# Patient Record
Sex: Male | Born: 1980 | Hispanic: No | Marital: Married | Smoking: Never smoker
Health system: Southern US, Community
[De-identification: ages and names within clinical notes are randomized; demographics above are authoritative.]

## PROBLEM LIST (undated history)

## (undated) HISTORY — PX: APPENDECTOMY: SHX54

---

## 2017-08-04 ENCOUNTER — Emergency Department (HOSPITAL_BASED_OUTPATIENT_CLINIC_OR_DEPARTMENT_OTHER)
Admission: EM | Admit: 2017-08-04 | Discharge: 2017-08-05 | Disposition: A | Discharge status: Home / Self Care | Payer: BLUE CROSS/BLUE SHIELD | Attending: Emergency Medicine | Admitting: Emergency Medicine

## 2017-08-04 ENCOUNTER — Other Ambulatory Visit: Payer: Self-pay

## 2017-08-04 ENCOUNTER — Emergency Department (HOSPITAL_BASED_OUTPATIENT_CLINIC_OR_DEPARTMENT_OTHER): Payer: BLUE CROSS/BLUE SHIELD

## 2017-08-04 ENCOUNTER — Encounter (HOSPITAL_BASED_OUTPATIENT_CLINIC_OR_DEPARTMENT_OTHER): Payer: Self-pay | Admitting: *Deleted

## 2017-08-04 DIAGNOSIS — S0990XA Unspecified injury of head, initial encounter: Secondary | ICD-10-CM | POA: Diagnosis present

## 2017-08-04 DIAGNOSIS — W0110XA Fall on same level from slipping, tripping and stumbling with subsequent striking against unspecified object, initial encounter: Secondary | ICD-10-CM | POA: Diagnosis not present

## 2017-08-04 DIAGNOSIS — Y999 Unspecified external cause status: Secondary | ICD-10-CM | POA: Diagnosis not present

## 2017-08-04 DIAGNOSIS — Y92009 Unspecified place in unspecified non-institutional (private) residence as the place of occurrence of the external cause: Secondary | ICD-10-CM | POA: Diagnosis not present

## 2017-08-04 DIAGNOSIS — W19XXXA Unspecified fall, initial encounter: Secondary | ICD-10-CM

## 2017-08-04 DIAGNOSIS — Y9301 Activity, walking, marching and hiking: Secondary | ICD-10-CM | POA: Insufficient documentation

## 2017-08-04 DIAGNOSIS — S0292XA Unspecified fracture of facial bones, initial encounter for closed fracture: Secondary | ICD-10-CM | POA: Insufficient documentation

## 2017-08-04 NOTE — ED Notes (Signed)
Pt. Has been seen by EDP Long.   Reports he fell on Thursday causing injury to his mouth and face.  Pt. Has been seen by an Endodontist.  Pt. Came here tonight to have CT due to his fall for unknown reason for syncopal episode and for black eye and facial bruises poss. Fractures.  Pt. Reports he feels fine.

## 2017-08-04 NOTE — ED Provider Notes (Signed)
Emergency Department Provider Note   I have reviewed the triage vital signs and the nursing notes.   HISTORY  Chief Complaint Fall   HPI Taylor Santos is a 37 y.o. male presents to the ED for evaluation after fall yesterday.  Patient states he was diagnosed with the flu and began taking Tamiflu and azithromycin.  He began having diarrhea and severe fatigue.  Patient states he was walking to the bathroom when he suddenly "face planted."  He had some confusion around the event and injured several of his front upper incisors.  He went to the dentist today with plan to see a specialist on Monday.  Patient states he is continued to have headache and swelling around the left eye.  He was talking with family about this who encouraged him to present to the emergency department for further evaluation. Denies any vision change or eye pain.    History reviewed. No pertinent past medical history.  There are no active problems to display for this patient.   Past Surgical History:  Procedure Laterality Date  . APPENDECTOMY      Current Outpatient Rx  . Order #: [id number] Class: Historical Med  . Order #: [id number] Class: Historical Med    Allergies Patient has no known allergies.  No family history on file.  Social History Social History   Tobacco Use  . Smoking status: Never Smoker  . Smokeless tobacco: Never Used  Substance Use Topics  . Alcohol use: No    Frequency: Never  . Drug use: No    Review of Systems  Constitutional: No fever/chills Eyes: No visual changes. ENT: No sore throat. Positive dental pain.  Cardiovascular: Denies chest pain. Respiratory: Denies shortness of breath. Gastrointestinal: No abdominal pain.  No nausea, no vomiting.  No diarrhea.  No constipation. Genitourinary: Negative for dysuria. Musculoskeletal: Negative for back pain. Positive left face pain.  Skin: Negative for rash. Neurological: Negative for focal weakness or numbness. Positive HA.     10-point ROS otherwise negative.  ____________________________________________   PHYSICAL EXAM:  VITAL SIGNS: ED Triage Vitals  Enc Vitals Group     BP 08/04/17 2142 123/80     Pulse Rate 08/04/17 2142 95     Resp 08/04/17 2142 20     Temp 08/04/17 2142 98.5 F (36.9 C)     Temp Source 08/04/17 2142 Oral     SpO2 08/04/17 2142 99 %     Weight 08/04/17 2140 182 lb (82.6 kg)     Height 08/04/17 2140 5' 10.5" (1.791 m)   Constitutional: Alert and oriented. Well appearing and in no acute distress. Eyes: Conjunctivae are normal. PERRL. EOMI. Ecchymosis around and under the left eye.  Head: Atraumatic. Nose: No congestion/rhinnorhea. Mouth/Throat: Mucous membranes are moist.  Oropharynx non-erythematous. Dental fractures of the maxillary incisors.  Neck: No stridor. No cervical spine tenderness to palpation. Cardiovascular: Normal rate, regular rhythm. Good peripheral circulation. Grossly normal heart sounds.   Respiratory: Normal respiratory effort.  No retractions. Lungs CTAB. Gastrointestinal:  No distention.  Musculoskeletal: No lower extremity tenderness nor edema. No gross deformities of extremities. Neurologic:  Normal speech and language. No gross focal neurologic deficits are appreciated.  Skin:  Skin is warm, dry and intact. No rash noted. Bruising as noted above.   ____________________________________________  RADIOLOGY  Ct Head Wo Contrast  Result Date: 08/04/2017 CLINICAL DATA:  Larey Seat, fractures in the front teeth EXAM: CT HEAD WITHOUT CONTRAST CT MAXILLOFACIAL WITHOUT CONTRAST TECHNIQUE: Multidetector CT imaging of the  head and maxillofacial structures were performed using the standard protocol without intravenous contrast. Multiplanar CT image reconstructions of the maxillofacial structures were also generated. COMPARISON:  None. FINDINGS: CT HEAD FINDINGS Brain: No acute territorial infarction, hemorrhage, or intracranial mass is visualized. The ventricles are of  normal size. Vascular: No hyperdense vessel or unexpected calcification. Skull: Normal. Negative for fracture or focal lesion. Other: Soft tissue emphysema within the left temple parietal region. CT MAXILLOFACIAL FINDINGS Osseous: Bilateral mandibular heads are normally positioned. No displaced mandibular fracture is visualized. Zygomatic arches are intact. Acute mildly displaced left lateral pterygoid plate fracture. Acute minimally displaced left medial pterygoid plate fracture. No acute nasal bone fracture is seen. Anterior to posterior oriented fracture through the left maxilla, paralleling the molar teeth with probable horizontal fracture component through the alveolar ridge at the second molar tooth. Fracture lucency extends to the floor of the left maxillary sinus. Orbits: No acute orbital fracture is seen. No intra or extraconal soft tissue abnormality. Sinuses: Hemorrhagic fluid level in the left maxillary sinus. Acute, mildly comminuted fracture involving the apex of the left maxillary sinus with extension of fracture lucency to the left posterior, lateral wall of the left maxillary sinus. Fracture at the floor of the left maxillary sinus. Fracture lateral wall anteriorly of the left maxillary sinus. Soft tissues: Moderate soft tissue emphysema within the left parapharyngeal space, and interspersed among the soft tissues around the left mandibular ramus, posterior and anterior walls of the left maxillary sinus, and extending superiorly to the left temporal region. IMPRESSION: 1. No CT evidence for acute intracranial abnormality. 2. Acute comminuted and mildly displaced fracture involving the apex of the left maxillary sinus with extension of fracture to the left posterior, lateral wall of the maxillary sinus. Additional left anterior maxillary wall sinus fracture. There is additional fracture through the floor of the left maxillary sinus which extends through the maxillary bone and through the left  posterior alveolar ridge at the level of the second molar. 3. Acute mildly displaced left lateral and medial pterygoid plate fractures. 4. Moderate amount of soft tissue emphysema within the deep neck soft tissues, surrounding the mandible and maxillary sinus and extending to the left temporal soft tissues. Electronically Signed   By: Jasmine Pang M.D.   On: 08/04/2017 23:05   Ct Maxillofacial Wo Contrast  Result Date: 08/04/2017 CLINICAL DATA:  Larey Seat, fractures in the front teeth EXAM: CT HEAD WITHOUT CONTRAST CT MAXILLOFACIAL WITHOUT CONTRAST TECHNIQUE: Multidetector CT imaging of the head and maxillofacial structures were performed using the standard protocol without intravenous contrast. Multiplanar CT image reconstructions of the maxillofacial structures were also generated. COMPARISON:  None. FINDINGS: CT HEAD FINDINGS Brain: No acute territorial infarction, hemorrhage, or intracranial mass is visualized. The ventricles are of normal size. Vascular: No hyperdense vessel or unexpected calcification. Skull: Normal. Negative for fracture or focal lesion. Other: Soft tissue emphysema within the left temple parietal region. CT MAXILLOFACIAL FINDINGS Osseous: Bilateral mandibular heads are normally positioned. No displaced mandibular fracture is visualized. Zygomatic arches are intact. Acute mildly displaced left lateral pterygoid plate fracture. Acute minimally displaced left medial pterygoid plate fracture. No acute nasal bone fracture is seen. Anterior to posterior oriented fracture through the left maxilla, paralleling the molar teeth with probable horizontal fracture component through the alveolar ridge at the second molar tooth. Fracture lucency extends to the floor of the left maxillary sinus. Orbits: No acute orbital fracture is seen. No intra or extraconal soft tissue abnormality. Sinuses: Hemorrhagic fluid level in  the left maxillary sinus. Acute, mildly comminuted fracture involving the apex of the  left maxillary sinus with extension of fracture lucency to the left posterior, lateral wall of the left maxillary sinus. Fracture at the floor of the left maxillary sinus. Fracture lateral wall anteriorly of the left maxillary sinus. Soft tissues: Moderate soft tissue emphysema within the left parapharyngeal space, and interspersed among the soft tissues around the left mandibular ramus, posterior and anterior walls of the left maxillary sinus, and extending superiorly to the left temporal region. IMPRESSION: 1. No CT evidence for acute intracranial abnormality. 2. Acute comminuted and mildly displaced fracture involving the apex of the left maxillary sinus with extension of fracture to the left posterior, lateral wall of the maxillary sinus. Additional left anterior maxillary wall sinus fracture. There is additional fracture through the floor of the left maxillary sinus which extends through the maxillary bone and through the left posterior alveolar ridge at the level of the second molar. 3. Acute mildly displaced left lateral and medial pterygoid plate fractures. 4. Moderate amount of soft tissue emphysema within the deep neck soft tissues, surrounding the mandible and maxillary sinus and extending to the left temporal soft tissues. Electronically Signed   By: Jasmine PangKim  Fujinaga M.D.   On: 08/04/2017 23:05    ____________________________________________   PROCEDURES  Procedure(s) performed:   Procedures  None ____________________________________________   INITIAL IMPRESSION / ASSESSMENT AND PLAN / ED COURSE  Pertinent labs & imaging results that were available during my care of the patient were reviewed by me and considered in my medical decision making (see chart for details).  Patient presents to the emergency department for evaluation after fall with face/head injury.  Suspect concussion clinically.  Patient has no focal neurological deficits but does have some ecchymosis around the left eye.   Plan for CT imaging of the head and face to rule out occult fracture/bleed.   12:00 AM Spoke with Dr. Jenne PaneBates with ENT. Agrees with sneeze precautions and 5 day office follow up. No abx. Discussed f/u plan in detail with the patient. Provided CT read to give his dental specialist for appointment on Monday.   At this time, I do not feel there is any life-threatening condition present. I have reviewed and discussed all results (EKG, imaging, lab, urine as appropriate), exam findings with patient. I have reviewed nursing notes and appropriate previous records.  I feel the patient is safe to be discharged home without further emergent workup. Discussed usual and customary return precautions. Patient and family (if present) verbalize understanding and are comfortable with this plan.  Patient will follow-up with their primary care provider. If they do not have a primary care provider, information for follow-up has been provided to them. All questions have been answered.  ____________________________________________  FINAL CLINICAL IMPRESSION(S) / ED DIAGNOSES  Final diagnoses:  Fall, initial encounter  Closed fracture of facial bone due to fall, initial encounter [facility](HCC)    Note:  This document was prepared using Dragon voice recognition software and may include unintentional dictation errors.  Alona BeneJoshua Dayzha Pogosyan, MD Emergency Medicine    Bobbijo Holst, Arlyss RepressJoshua G, MD 08/05/17 [phone]

## 2017-08-04 NOTE — ED Triage Notes (Signed)
He had a positive flu swab 2 days ago. He took Tamaflu and Zithromax. States he fell yesterday, landed on his face and broke some teeth. He was seen by his orthodontist today for mouth injury. He is here tonight for examination to see if he needs a head CT. He did not hit his head. He does not remember events of the fall.

## 2017-08-05 NOTE — Discharge Instructions (Signed)
You were seen in the ED today after a fall. Your head CT was normal but your face CT showed multiple fractures. You will need to call the ENT listed for follow up appointment in 5 days. Sneeze with your mouth open to avoid extra pressure in the face.   Return to the ED with any new or worsening symptoms.

## 2018-08-25 IMAGING — CT CT MAXILLOFACIAL W/O CM
3 series · 14 of 47 positions shown, 16 images · non-contrast
Comparison: None.

CLINICAL DATA: Fell, fractures in the front teeth

EXAM:
CT HEAD WITHOUT CONTRAST
CT MAXILLOFACIAL WITHOUT CONTRAST
TECHNIQUE: Multidetector CT imaging of the head and maxillofacial structures
were performed using the standard protocol without intravenous
contrast. Multiplanar CT image reconstructions of the maxillofacial
structures were also generated.

[Series 2: max soft · axial · 0.33mm/px · z∈[+1010,+1172]mm · 8 of 95 slices shown, 10 images]
[im 7/95  brain]
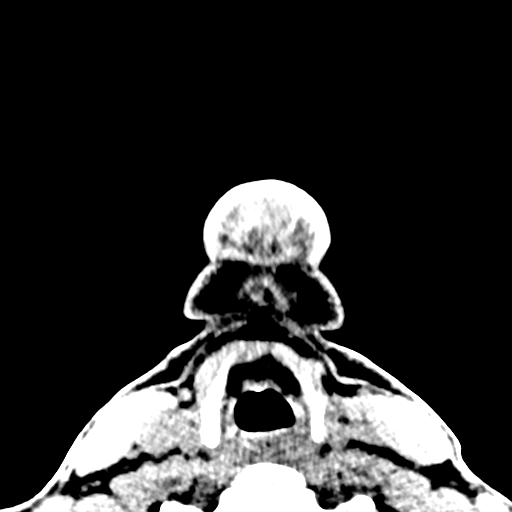
[im 7/95  bone]
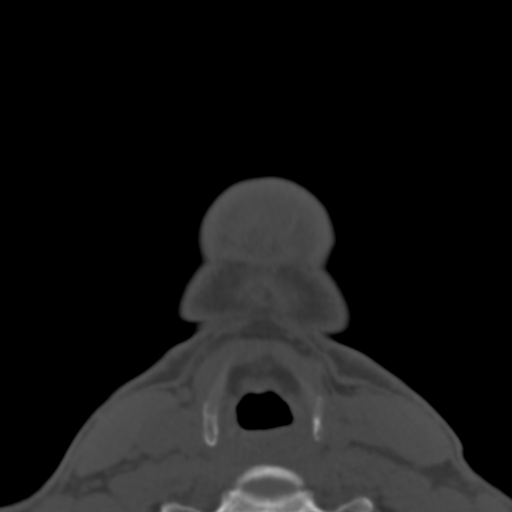
[im 20/95  bone]
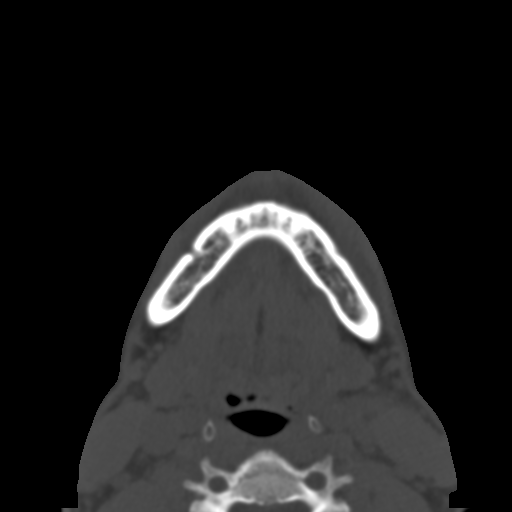
[im 30/95  bone]
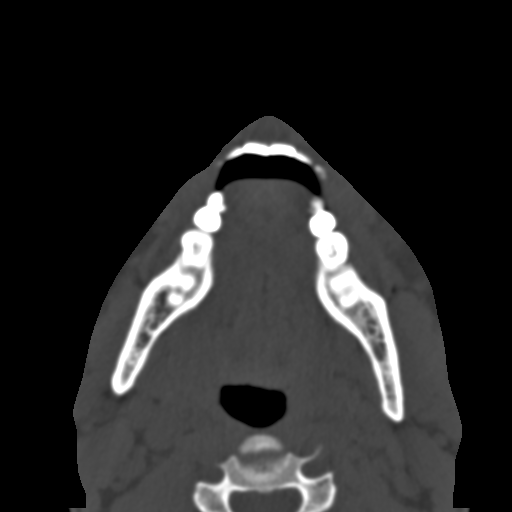
[im 43/95  bone]
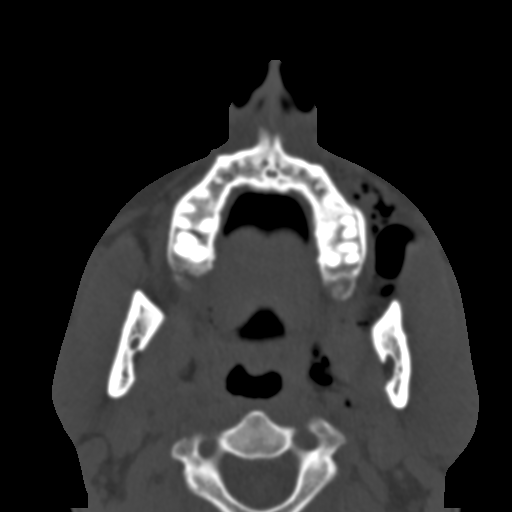
[im 52/95  brain]
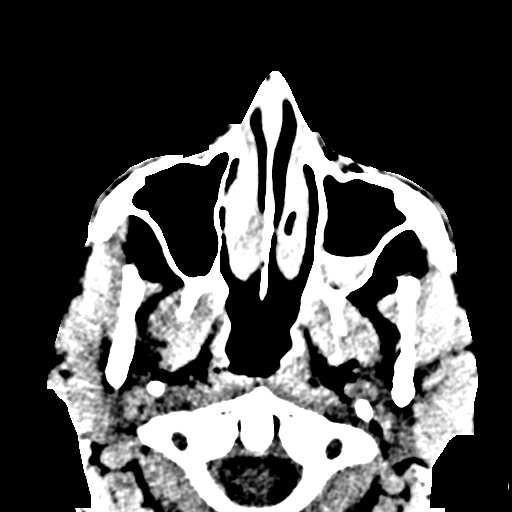
[im 52/95  bone]
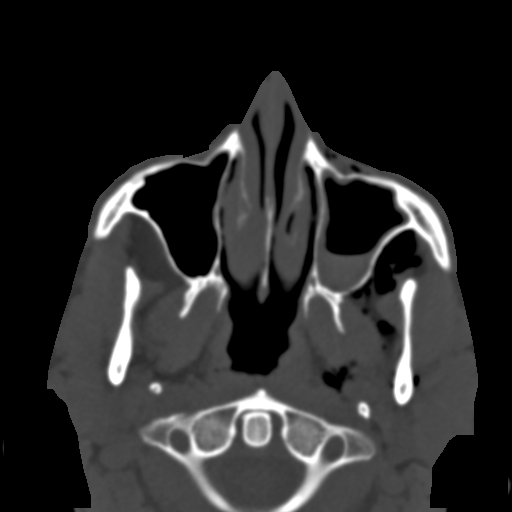
[im 65/95  bone]
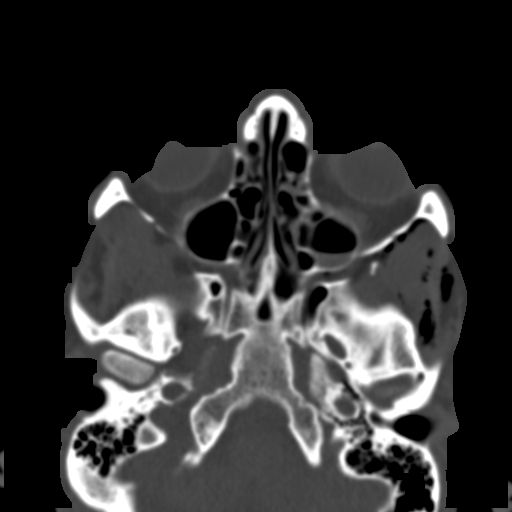
[im 75/95  bone]
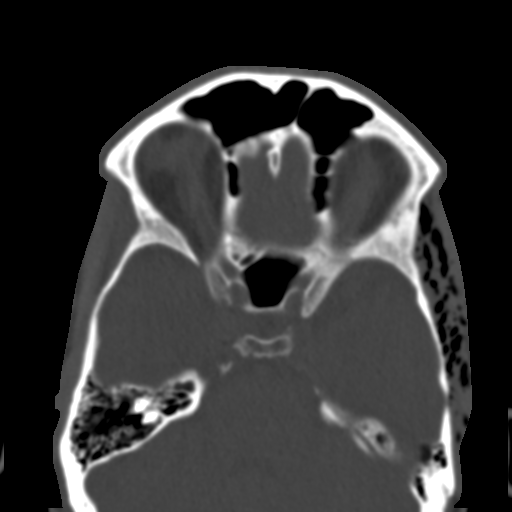
[im 88/95  bone]
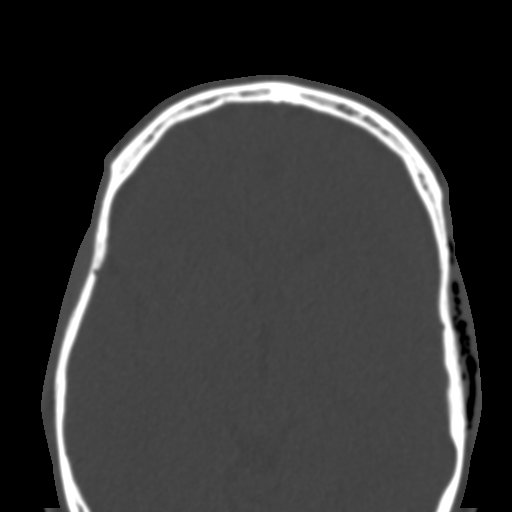

[Series 6: coronal soft · coronal · 0.35mm/px · 3 of 83 slices shown]
[im 28/83  bone]
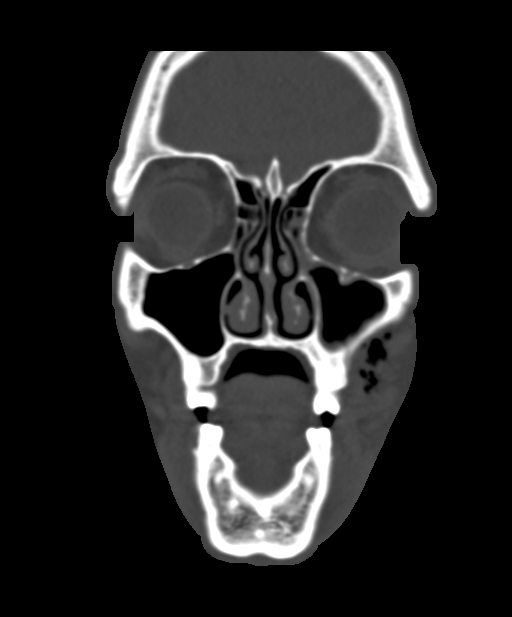
[im 37/83  bone]
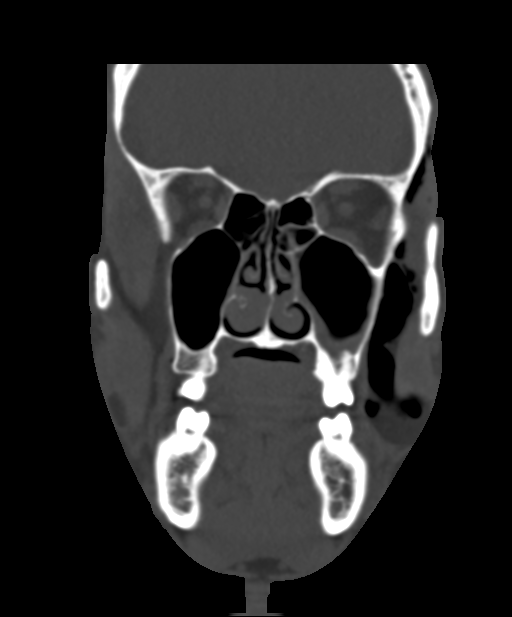
[im 46/83  bone]
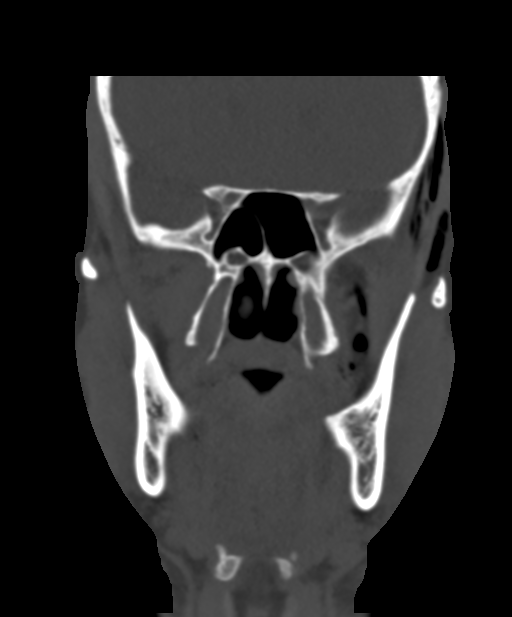

[Series 7: sagittal soft · sagittal · 0.34mm/px · 3 of 83 slices shown]
[im 28/83  bone]
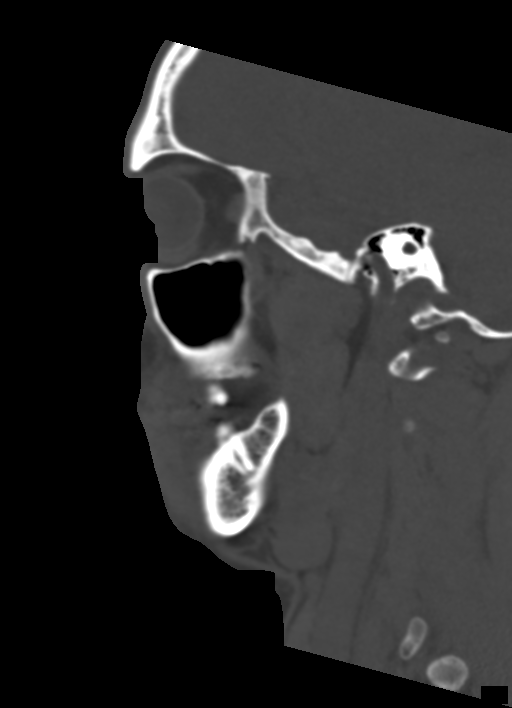
[im 42/83  bone]
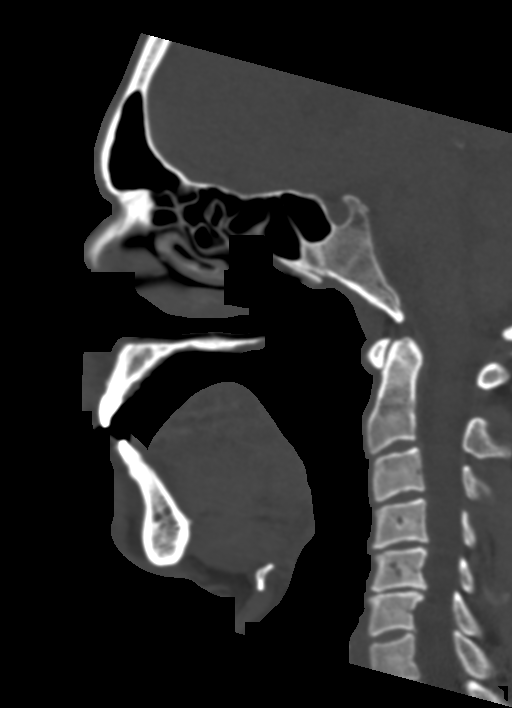
[im 55/83  bone]
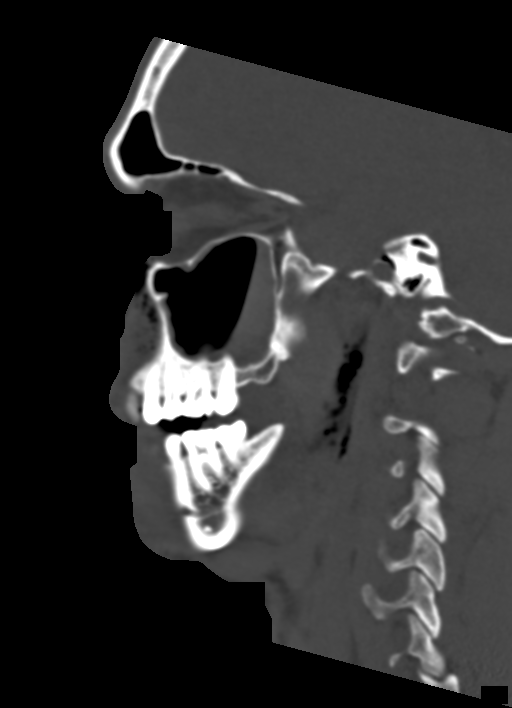

[14 of 47 positions shown; findings below may reference images not displayed]

FINDINGS: CT HEAD FINDINGS

Brain: No acute territorial infarction, hemorrhage, or intracranial
mass is visualized. The ventricles are of normal size.

Vascular: No hyperdense vessel or unexpected calcification.

Skull: Normal. Negative for fracture or focal lesion.

Other: Soft tissue emphysema within the left temple parietal region.

CT MAXILLOFACIAL FINDINGS

Osseous: Bilateral mandibular heads are normally positioned. No
displaced mandibular fracture is visualized. Zygomatic arches are
intact. Acute mildly displaced left lateral pterygoid plate
fracture. Acute minimally displaced left medial pterygoid plate
fracture. No acute nasal bone fracture is seen. Anterior to
posterior oriented fracture through the left maxilla, paralleling
the molar teeth with probable horizontal fracture component through
the alveolar ridge at the second molar tooth. Fracture lucency
extends to the floor of the left maxillary sinus.

Orbits: No acute orbital fracture is seen. No intra or extraconal
soft tissue abnormality.

Sinuses: Hemorrhagic fluid level in the left maxillary sinus. Acute,
mildly comminuted fracture involving the apex of the left maxillary
sinus with extension of fracture lucency to the left posterior,
lateral wall of the left maxillary sinus. Fracture at the floor of
the left maxillary sinus. Fracture lateral wall anteriorly of the
left maxillary sinus.

Soft tissues: Moderate soft tissue emphysema within the left
parapharyngeal space, and interspersed among the soft tissues around
the left mandibular ramus, posterior and anterior walls of the left
maxillary sinus, and extending superiorly to the left temporal
region.
IMPRESSION: 1. No CT evidence for acute intracranial abnormality.
2. Acute comminuted and mildly displaced fracture involving the apex
of the left maxillary sinus with extension of fracture to the left
posterior, lateral wall of the maxillary sinus. Additional left
anterior maxillary wall sinus fracture. There is additional fracture
through the floor of the left maxillary sinus which extends through
the maxillary bone and through the left posterior alveolar ridge at
the level of the second molar.
3. Acute mildly displaced left lateral and medial pterygoid plate
fractures.
4. Moderate amount of soft tissue emphysema within the deep neck
soft tissues, surrounding the mandible and maxillary sinus and
extending to the left temporal soft tissues.
